# Patient Record
Sex: Male | Born: 1965
Health system: Southern US, Community
[De-identification: ages and names within clinical notes are randomized; demographics above are authoritative.]

## PROBLEM LIST (undated history)

## (undated) DIAGNOSIS — D573 Sickle-cell trait: Secondary | ICD-10-CM

## (undated) DIAGNOSIS — G4730 Sleep apnea, unspecified: Secondary | ICD-10-CM

## (undated) DIAGNOSIS — I10 Essential (primary) hypertension: Secondary | ICD-10-CM

## (undated) DIAGNOSIS — E7800 Pure hypercholesterolemia, unspecified: Secondary | ICD-10-CM

## (undated) DIAGNOSIS — E538 Deficiency of other specified B group vitamins: Secondary | ICD-10-CM

## (undated) HISTORY — PX: OTHER SURGICAL HISTORY: SHX169

## (undated) HISTORY — DX: Deficiency of other specified B group vitamins: E53.8

---

## 2009-12-17 ENCOUNTER — Emergency Department (HOSPITAL_BASED_OUTPATIENT_CLINIC_OR_DEPARTMENT_OTHER): Admission: EM | Admit: 2009-12-17 | Discharge: 2009-12-18 | Payer: Self-pay | Admitting: Emergency Medicine

## 2010-11-07 ENCOUNTER — Emergency Department (HOSPITAL_BASED_OUTPATIENT_CLINIC_OR_DEPARTMENT_OTHER)
Admission: EM | Admit: 2010-11-07 | Discharge: 2010-11-07 | Payer: Self-pay | Source: Home / Self Care | Admitting: Emergency Medicine

## 2010-11-12 LAB — URINALYSIS, ROUTINE W REFLEX MICROSCOPIC
Nitrite: NEGATIVE
Specific Gravity, Urine: 1.003 — ABNORMAL LOW (ref 1.005–1.030)
Urobilinogen, UA: 0.2 mg/dL (ref 0.0–1.0)
pH: 6.5 (ref 5.0–8.0)

## 2010-11-12 LAB — COMPREHENSIVE METABOLIC PANEL
AST: 48 U/L — ABNORMAL HIGH (ref 0–37)
Albumin: 5 g/dL (ref 3.5–5.2)
Alkaline Phosphatase: 95 U/L (ref 39–117)
Chloride: 101 mEq/L (ref 96–112)
Creatinine, Ser: 1 mg/dL (ref 0.4–1.5)
GFR calc Af Amer: >60 mL/min (ref >60)
Potassium: 4.8 mEq/L (ref 3.5–5.1)
Total Bilirubin: 1.1 mg/dL (ref 0.3–1.2)
Total Protein: 8.4 g/dL — ABNORMAL HIGH (ref 6.0–8.3)

## 2010-11-12 LAB — CBC
HCT: 37.6 % — ABNORMAL LOW (ref 39.0–52.0)
MCH: 20.8 pg — ABNORMAL LOW (ref 26.0–34.0)
MCV: 59.6 fL — ABNORMAL LOW (ref 78.0–100.0)
Platelets: 279 10*3/uL (ref 150–400)
RBC: 6.31 MIL/uL — ABNORMAL HIGH (ref 4.22–5.81)
WBC: 11.1 10*3/uL — ABNORMAL HIGH (ref 4.0–10.5)

## 2010-11-12 LAB — DIFFERENTIAL
Basophils Relative: 0 % (ref 0–1)
Eosinophils Absolute: 0.1 10*3/uL (ref 0.0–0.7)
Eosinophils Relative: 1 % (ref 0–5)
Lymphocytes Relative: 19 % (ref 12–46)
Neutro Abs: 7.9 10*3/uL — ABNORMAL HIGH (ref 1.7–7.7)

## 2010-11-12 LAB — POCT CARDIAC MARKERS
CKMB, poc: 2.1 ng/mL (ref 1.0–8.0)
Troponin i, poc: <0.05 ng/mL (ref 0.00–0.09)

## 2010-11-12 LAB — GLUCOSE, CAPILLARY: Glucose-Capillary: 118 mg/dL — ABNORMAL HIGH (ref 70–99)

## 2011-01-24 ENCOUNTER — Emergency Department (HOSPITAL_BASED_OUTPATIENT_CLINIC_OR_DEPARTMENT_OTHER)
Admission: EM | Admit: 2011-01-24 | Discharge: 2011-01-24 | Disposition: A | Discharge status: Home / Self Care | Payer: 59 | Attending: Emergency Medicine | Admitting: Emergency Medicine

## 2011-01-24 DIAGNOSIS — I10 Essential (primary) hypertension: Secondary | ICD-10-CM | POA: Insufficient documentation

## 2011-01-24 DIAGNOSIS — E7800 Pure hypercholesterolemia, unspecified: Secondary | ICD-10-CM | POA: Insufficient documentation

## 2011-01-24 LAB — GLUCOSE, CAPILLARY: Glucose-Capillary: 105 mg/dL — ABNORMAL HIGH (ref 70–99)

## 2012-04-10 IMAGING — CR DG CHEST 2V
2 series · 2 of 2 positions shown · non-contrast
Comparison: None.

CLINICAL DATA: Rapid pulse, dizziness

CHEST - 2 VIEW

[w chest pa]
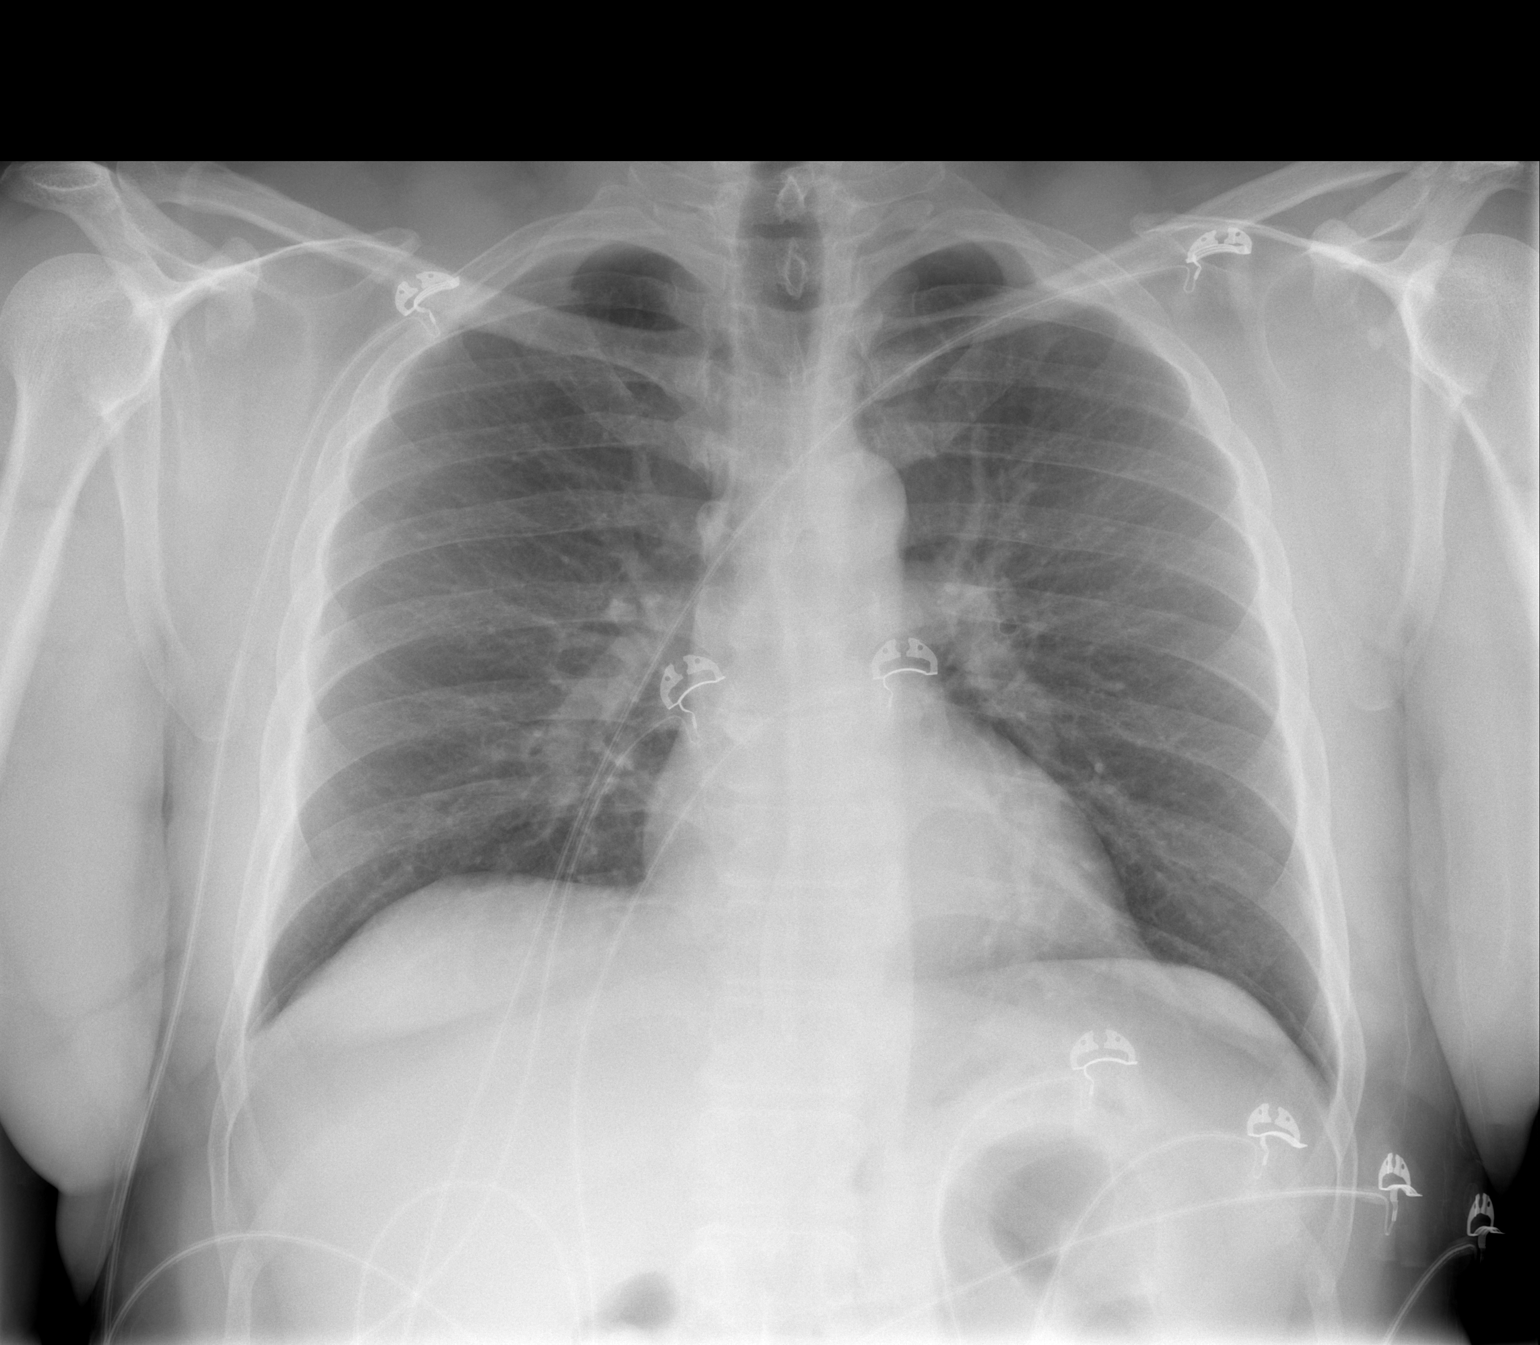

[w chest lat]
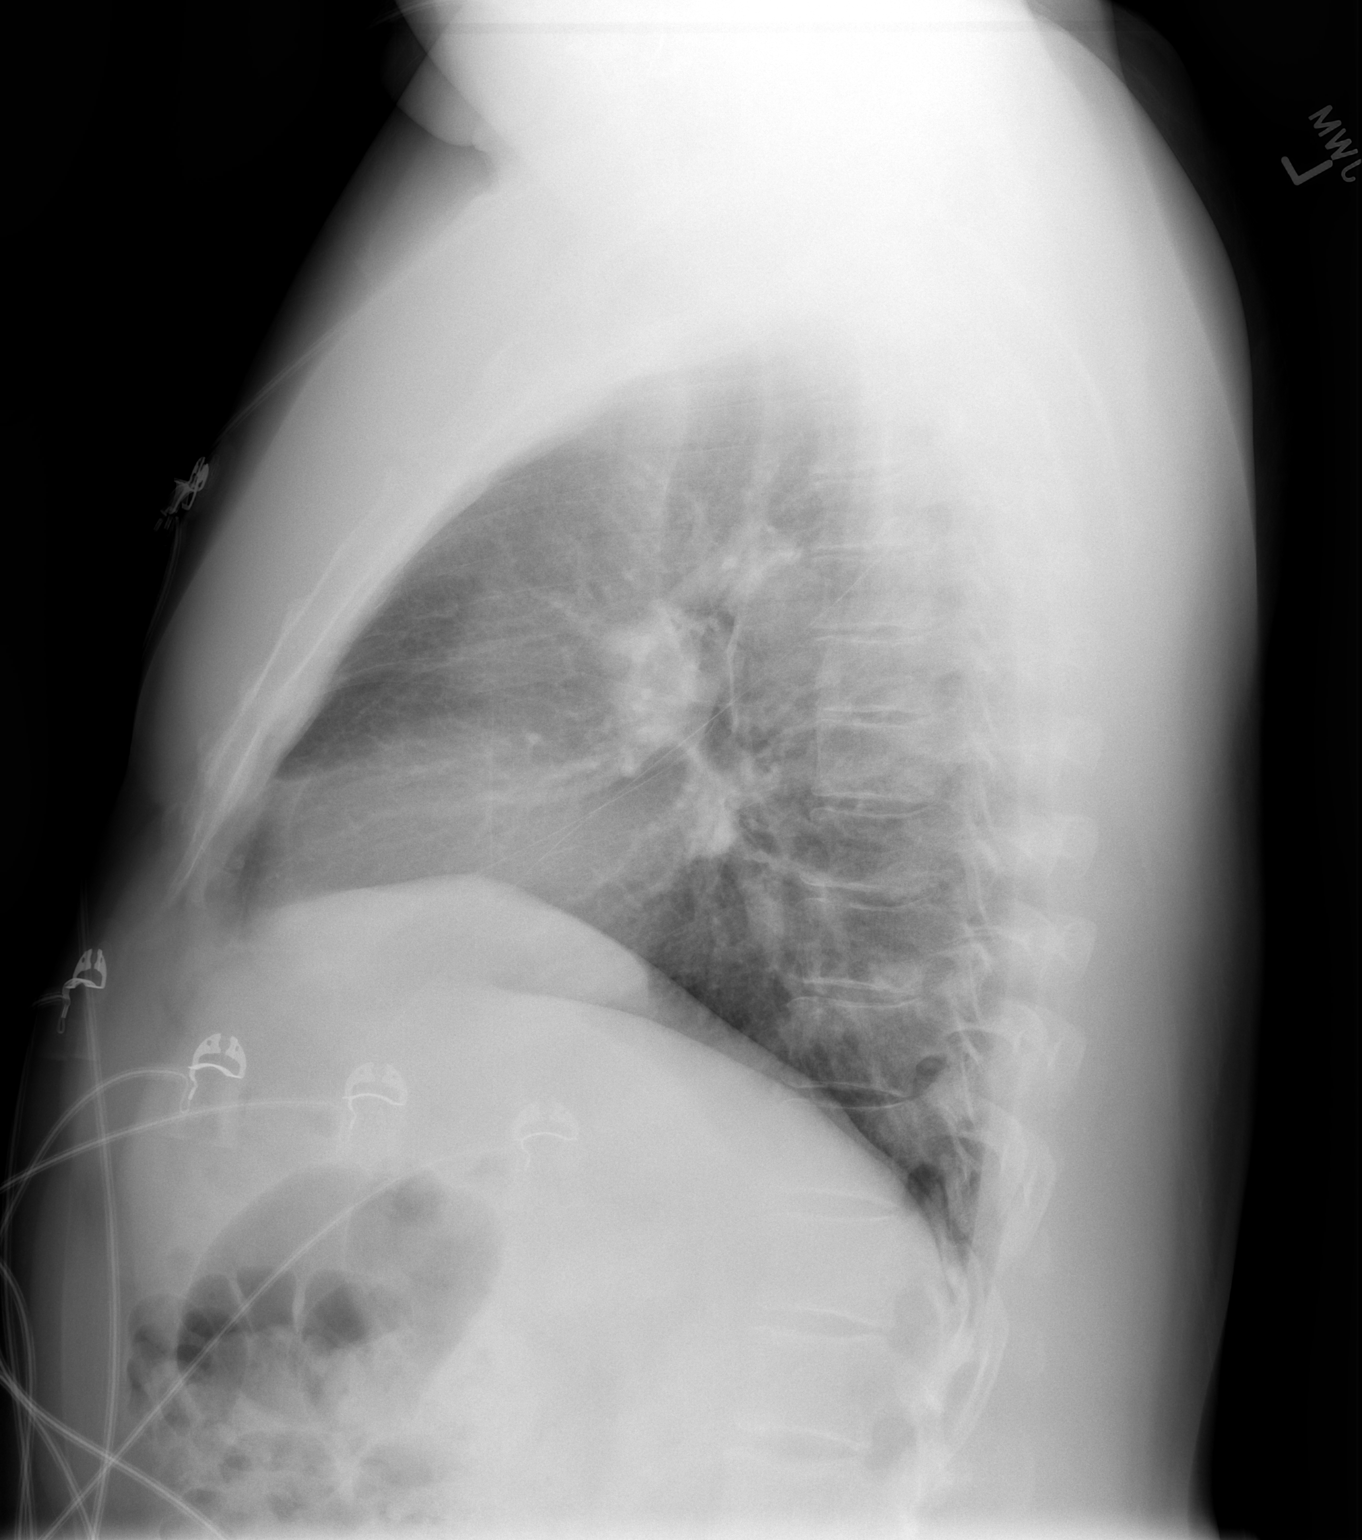

[2 of 2 positions shown; findings below may reference images not displayed]

FINDINGS: The lungs are clear.  Mediastinal contours appear normal.
The heart is within normal limits in size.  No bony abnormality is
seen.
IMPRESSION: No active lung disease.

## 2014-02-23 DIAGNOSIS — E7800 Pure hypercholesterolemia, unspecified: Secondary | ICD-10-CM | POA: Insufficient documentation

## 2014-12-02 DIAGNOSIS — E611 Iron deficiency: Secondary | ICD-10-CM | POA: Insufficient documentation

## 2016-01-08 DIAGNOSIS — D5780 Other sickle-cell disorders without crisis: Secondary | ICD-10-CM | POA: Insufficient documentation

## 2016-10-15 ENCOUNTER — Emergency Department (HOSPITAL_BASED_OUTPATIENT_CLINIC_OR_DEPARTMENT_OTHER): Payer: 59

## 2016-10-15 ENCOUNTER — Encounter (HOSPITAL_BASED_OUTPATIENT_CLINIC_OR_DEPARTMENT_OTHER): Payer: Self-pay

## 2016-10-15 ENCOUNTER — Emergency Department (HOSPITAL_BASED_OUTPATIENT_CLINIC_OR_DEPARTMENT_OTHER)
Admission: EM | Admit: 2016-10-15 | Discharge: 2016-10-15 | Disposition: A | Discharge status: Home / Self Care | Payer: 59 | Attending: Emergency Medicine | Admitting: Emergency Medicine

## 2016-10-15 DIAGNOSIS — I10 Essential (primary) hypertension: Secondary | ICD-10-CM

## 2016-10-15 DIAGNOSIS — R05 Cough: Secondary | ICD-10-CM | POA: Diagnosis not present

## 2016-10-15 DIAGNOSIS — R000 Tachycardia, unspecified: Secondary | ICD-10-CM

## 2016-10-15 DIAGNOSIS — R059 Cough, unspecified: Secondary | ICD-10-CM

## 2016-10-15 HISTORY — DX: Sickle-cell trait: D57.3

## 2016-10-15 HISTORY — DX: Essential (primary) hypertension: I10

## 2016-10-15 HISTORY — DX: Pure hypercholesterolemia, unspecified: E78.00

## 2016-10-15 HISTORY — DX: Sleep apnea, unspecified: G47.30

## 2016-10-15 LAB — CBC
HEMATOCRIT: 36.2 % — AB (ref 39.0–52.0)
HEMOGLOBIN: 12.1 g/dL — AB (ref 13.0–17.0)
MCH: 20.7 pg — AB (ref 26.0–34.0)
MCHC: 33.4 g/dL (ref 30.0–36.0)
MCV: 61.9 fL — AB (ref 78.0–100.0)
Platelets: 268 10*3/uL (ref 150–400)
RBC: 5.85 MIL/uL — ABNORMAL HIGH (ref 4.22–5.81)
RDW: 19.7 % — AB (ref 11.5–15.5)
WBC: 8.6 10*3/uL (ref 4.0–10.5)

## 2016-10-15 LAB — BASIC METABOLIC PANEL
Anion gap: 9 (ref 5–15)
BUN: 16 mg/dL (ref 6–20)
CALCIUM: 9.3 mg/dL (ref 8.9–10.3)
CHLORIDE: 102 mmol/L (ref 101–111)
CO2: 25 mmol/L (ref 22–32)
CREATININE: 1.02 mg/dL (ref 0.61–1.24)
GFR calc Af Amer: >60 mL/min (ref >60)
GFR calc non Af Amer: >60 mL/min (ref >60)
GLUCOSE: 115 mg/dL — AB (ref 65–99)
Potassium: 3.9 mmol/L (ref 3.5–5.1)
Sodium: 136 mmol/L (ref 135–145)

## 2016-10-15 MED ORDER — LORAZEPAM 1 MG PO TABS
1.0000 mg | ORAL_TABLET | Freq: Once | ORAL | Status: AC
  Administered 2016-10-15: 1 mg via ORAL
  Filled 2016-10-15: qty 1

## 2016-10-15 MED ORDER — SODIUM CHLORIDE 0.9 % IV BOLUS (SEPSIS)
1000.0000 mL | Freq: Once | INTRAVENOUS | Status: AC
  Administered 2016-10-15: 1000 mL via INTRAVENOUS

## 2016-10-15 NOTE — ED Notes (Signed)
Pt placed on cardiac monitor 

## 2016-10-15 NOTE — ED Triage Notes (Addendum)
C/o HTN and fast heart rate x today after taking mucinex for cough-NAD-pt states he is anxious-tapping feet on the ground-steady gait

## 2016-10-15 NOTE — ED Provider Notes (Signed)
[facility]   CSN: [id number] Arrival date & time: 10/15/16  1208     History   Chief Complaint Chief Complaint  Patient presents with  . Hypertension    HPI Philip Harris is a 50 y.o. male.  HPI  Pt presenting with concern for hypertension and high heart rate after taking mucinex DM for cough and cold symptoms today.  He states that he took his blood pressure multiple times and it was elevated, he became worried and his heart rate got higher and higher.  No chest pain.  No fever/chills.  No difficulty breathing. Cough is congested and productive.  He has not had any leg swelling, vomiting or diarrhea.  States he has been drinking liquids.  He thinks the medication he took may have had a decongestant in it- he describes a green and white box of mucinex DM.  There are no other associated systemic symptoms, there are no other alleviating or modifying factors.   Past Medical History:  Diagnosis Date  . High cholesterol   . Hypertension   . Sickle cell trait (HCC)   . Sleep apnea     There are no active problems to display for this patient.   History reviewed. No pertinent surgical history.     Home Medications    Prior to Admission medications   Medication Sig Start Date End Date Taking? Authorizing Provider  amLODipine (NORVASC) 5 MG tablet Take 5 mg by mouth daily.   Yes Historical Provider, MD  SIMVASTATIN PO Take by mouth.   Yes Historical Provider, MD    Family History No family history on file.  Social History Social History  Substance Use Topics  . Smoking status: Never Smoker  . Smokeless tobacco: Never Used  . Alcohol use Yes     Comment: occ     Allergies   Dextromethorphan   Review of Systems Review of Systems  ROS reviewed and all otherwise negative except for mentioned in HPI   Physical Exam Updated Vital Signs BP 140/75 (BP Location: Right Arm)   Pulse 100   Temp 98.8 F (37.1 C) (Oral)   Resp 18    SpO2 96%  Vitals reviewed Physical Exam Physical Examination: General appearance - alert, well appearing, and in no distress Mental status - alert, oriented to person, place, and time Eyes - no conjunctival injection, no scleral icterus Mouth - mucous membranes moist, pharynx normal without lesions Neck - supple, no significant adenopathy Chest - clear to auscultation, no wheezes, rales or rhonchi, symmetric air entry Heart - normal rate, regular rhythm, normal S1, S2, no murmurs, rubs, clicks or gallops Abdomen - soft, nontender, nondistended, no masses or organomegaly Neurological - alert, oriented, normal speech Extremities - peripheral pulses normal, no pedal edema, no clubbing or cyanosis Skin - normal coloration and turgor, no rashes Psych- anxious, shaking his leg  ED Treatments / Results  Labs (all labs ordered are listed, but only abnormal results are displayed) Labs Reviewed  CBC - Abnormal; Notable for the following:       Result Value   RBC 5.85 (*)    Hemoglobin 12.1 (*)    HCT 36.2 (*)    MCV 61.9 (*)    MCH 20.7 (*)    RDW 19.7 (*)    All other components within normal limits  BASIC METABOLIC PANEL - Abnormal; Notable for the following:    Glucose, Bld 115 (*)    All other components within normal limits  EKG  EKG Interpretation  Date/Time:  Tuesday October 15 2016 12:32:19 EST Ventricular Rate:  126 PR Interval:  150 QRS Duration: 68 QT Interval:  316 QTC Calculation: 457 R Axis:   82 Text Interpretation:  Sinus tachycardia ST & T wave abnormality, consider inferior ischemia Abnormal ECG artifact makes comparison difficult but no acute changes noted Confirmed by Karma GanjaLINKER  MD, Elgin Carn [phone]([id number]) on 10/15/2016 1:43:44 PM       Radiology Dg Chest 2 View  Result Date: 10/15/2016 CLINICAL DATA:  Hypertension and tachycardia today EXAM: CHEST  2 VIEW COMPARISON:  11/07/2010 FINDINGS: The lungs appear clear.  Cardiac and mediastinal contours normal. No  pleural effusion identified. Mild thoracic spondylosis. IMPRESSION: 1.  No active cardiopulmonary disease is radiographically apparent. 2. Mild thoracic spondylosis. Electronically Signed   By: Gaylyn RongWalter  Liebkemann M.D.   On: 10/15/2016 16:11    Procedures Procedures (including critical care time)  Medications Ordered in ED Medications  sodium chloride 0.9 % bolus 1,000 mL (0 mLs Intravenous Stopped 10/15/16 1456)  LORazepam (ATIVAN) tablet 1 mg (1 mg Oral Given 10/15/16 1352)  sodium chloride 0.9 % bolus 1,000 mL (0 mLs Intravenous Stopped 10/15/16 1556)     Initial Impression / Assessment and Plan / ED Course  I have reviewed the triage vital signs and the nursing notes.  Pertinent labs & imaging results that were available during my care of the patient were reviewed by me and considered in my medical decision making (see chart for details).  Clinical Course   4:10 PM HR is trending down- from nearly 130s to approx 100-105 on monitor.  Pt has no signs of end organ damage.    Pt presenting due to hypertension and elevated heart rated after taking cold medication today.  Pt is very anxious and tapping his feet which may be contributing to the tachycardia- although this is improving during ED stay.  CXR without pnuemonia.  Discharged with strict return precautions.  Pt agreeable with plan.  Final Clinical Impressions(s) / ED Diagnoses   Final diagnoses:  Hypertension, unspecified type  Tachycardia  Cough    New Prescriptions Discharge Medication List as of 10/15/2016  4:17 PM       Jerelyn ScottMartha Linker, MD 10/16/16 [phone]

## 2016-10-15 NOTE — ED Notes (Signed)
Pt states the medicine he took was Mucinex DM.

## 2016-10-15 NOTE — Discharge Instructions (Signed)
Return to the ED with any concerns including difficulty breathing, chest pain, weakness in arms or legs, changes in vision or speech, decreased level of alertness/lethargy, or any other alarming symptoms

## 2016-10-16 ENCOUNTER — Emergency Department (HOSPITAL_BASED_OUTPATIENT_CLINIC_OR_DEPARTMENT_OTHER)
Admission: EM | Admit: 2016-10-16 | Discharge: 2016-10-16 | Disposition: A | Discharge status: Home / Self Care | Payer: 59 | Attending: Emergency Medicine | Admitting: Emergency Medicine

## 2016-10-16 ENCOUNTER — Encounter (HOSPITAL_BASED_OUTPATIENT_CLINIC_OR_DEPARTMENT_OTHER): Payer: Self-pay

## 2016-10-16 DIAGNOSIS — I10 Essential (primary) hypertension: Secondary | ICD-10-CM | POA: Diagnosis not present

## 2016-10-16 DIAGNOSIS — F419 Anxiety disorder, unspecified: Secondary | ICD-10-CM | POA: Insufficient documentation

## 2016-10-16 DIAGNOSIS — R0602 Shortness of breath: Secondary | ICD-10-CM | POA: Insufficient documentation

## 2016-10-16 DIAGNOSIS — Z79899 Other long term (current) drug therapy: Secondary | ICD-10-CM | POA: Insufficient documentation

## 2016-10-16 DIAGNOSIS — F418 Other specified anxiety disorders: Secondary | ICD-10-CM

## 2016-10-16 DIAGNOSIS — R000 Tachycardia, unspecified: Secondary | ICD-10-CM | POA: Diagnosis present

## 2016-10-16 MED ORDER — CLONIDINE HCL 0.1 MG PO TABS: 0.1000 mg | ORAL_TABLET | Freq: Two times a day (BID) | ORAL | 0 refills | Status: DC | PRN

## 2016-10-16 MED ORDER — CLONIDINE HCL 0.1 MG PO TABS
0.2000 mg | ORAL_TABLET | Freq: Once | ORAL | Status: AC
  Administered 2016-10-16: 0.2 mg via ORAL
  Filled 2016-10-16: qty 2

## 2016-10-16 MED ORDER — CLONIDINE HCL 0.2 MG PO TABS: 0.2000 mg | ORAL_TABLET | Freq: Two times a day (BID) | ORAL | 0 refills | Status: DC | PRN

## 2016-10-16 MED FILL — cloNIDine HCL 0.1 MG TABS: 0.1 | 3 days supply | Qty: 6 | Fill #0

## 2016-10-16 NOTE — ED Triage Notes (Signed)
Pt treating cold s/s with OTC meds-c/o heart racing after meds-same s/s yesterday-was seen here for same-NAD-steady gait

## 2016-10-16 NOTE — ED Provider Notes (Signed)
[facility]   CSN: [id number] Arrival date & time: 10/16/16  1350     History   Chief Complaint Chief Complaint  Patient presents with  . Tachycardia    HPI Philip Harris is a 50 y.o. male.  The history is provided by the patient.  Anxiety  This is a recurrent problem. The current episode started 6 to 12 hours ago. The problem occurs constantly. The problem has not changed since onset.Associated symptoms include shortness of breath. Pertinent negatives include no chest pain. Exacerbated by: oral decongestants taken today and yesterday. Nothing relieves the symptoms.    Past Medical History:  Diagnosis Date  . High cholesterol   . Hypertension   . Sickle cell trait (HCC)   . Sleep apnea     There are no active problems to display for this patient.   History reviewed. No pertinent surgical history.     Home Medications    Prior to Admission medications   Medication Sig Start Date End Date Taking? Authorizing Provider  amLODipine (NORVASC) 5 MG tablet Take 5 mg by mouth daily.    Historical Provider, MD  SIMVASTATIN PO Take by mouth.    Historical Provider, MD    Family History No family history on file.  Social History Social History  Substance Use Topics  . Smoking status: Never Smoker  . Smokeless tobacco: Never Used  . Alcohol use Yes     Comment: occ     Allergies   Dextromethorphan   Review of Systems Review of Systems  HENT: Positive for congestion (and pressure in left ear ).   Respiratory: Positive for shortness of breath.   Cardiovascular: Negative for chest pain.  All other systems reviewed and are negative.    Physical Exam Updated Vital Signs BP 130/73   Pulse 93   Temp 98.1 F (36.7 C) (Oral)   Resp 18   SpO2 96%   Physical Exam  Constitutional: He is oriented to person, place, and time. He appears well-developed and well-nourished. No distress.  HENT:  Head: Normocephalic and atraumatic.    Right Ear: Tympanic membrane normal. No middle ear effusion.  Left Ear: Tympanic membrane normal.  No middle ear effusion.  Nose: Nose normal.  Eyes: Conjunctivae are normal.  Neck: Neck supple. No tracheal deviation present.  Cardiovascular: Regular rhythm.  Tachycardia present.   Pulmonary/Chest: Effort normal and breath sounds normal. No respiratory distress.  Abdominal: Soft. He exhibits no distension. There is no tenderness.  Neurological: He is alert and oriented to person, place, and time.  Skin: Skin is warm and dry.  Psychiatric: His mood appears anxious.     ED Treatments / Results  Labs (all labs ordered are listed, but only abnormal results are displayed) Labs Reviewed - No data to display  EKG  EKG Interpretation  Date/Time:  Wednesday October 16 2016 14:05:04 EST Ventricular Rate:  126 PR Interval:  148 QRS Duration: 82 QT Interval:  304 QTC Calculation: 440 R Axis:   80 Text Interpretation:  Sinus tachycardia ST & T wave abnormality, consider inferolateral ischemia Abnormal ECG No significant change since last tracing Confirmed by [facility] Center[facility]  MD, MARTHA [phone]) on 10/16/2016 2:30:13 PM       Radiology Dg Chest 2 View  Result Date: 10/15/2016 CLINICAL DATA:  Hypertension and tachycardia today EXAM: CHEST  2 VIEW COMPARISON:  11/07/2010 FINDINGS: The lungs appear clear.  Cardiac and mediastinal contours normal. No pleural effusion identified. Mild thoracic spondylosis. IMPRESSION:  1.  No active cardiopulmonary disease is radiographically apparent. 2. Mild thoracic spondylosis. Electronically Signed   By: Gaylyn RongWalter  Liebkemann M.D.   On: 10/15/2016 16:11    Procedures Procedures (including critical care time)  Medications Ordered in ED Medications  cloNIDine (CATAPRES) tablet 0.2 mg (0.2 mg Oral Given 10/16/16 1548)     Initial Impression / Assessment and Plan / ED Course  I have reviewed the triage vital signs and the nursing notes.  Pertinent labs &  imaging results that were available during my care of the patient were reviewed by me and considered in my medical decision making (see chart for details).  Clinical Course     50 y.o. male presents with recurrent episode of palpitations after taking oral decongestants. He is very concerned that his BP has been elevated and his HR high and is upset because he cannot find anything to treat his left ear congestion. With just resting his heart rate fell to the low 90s. Well appearing. I suspect he has a viral URI and appears very anxious about his health but has had no chest pain or other concerning features. No signs of PE. Tachycardia is all sinus. Given clonidine with improvement of anxiety and provided a few pills for breakthrough anxiety at home. I recommended he take an antihistamine with lower side effect profile than the decongestants he has been using. Plan to follow up with PCP as needed and return precautions discussed for worsening or new concerning symptoms.   Final Clinical Impressions(s) / ED Diagnoses   Final diagnoses:  Anxiety about health  Sinus tachycardia    New Prescriptions Discharge Medication List as of 10/16/2016  4:24 PM       Lyndal Pulleyaniel Deni Berti, MD 10/17/16 0151

## 2016-10-16 NOTE — ED Notes (Signed)
ED Provider at bedside. 

## 2017-11-17 ENCOUNTER — Emergency Department (HOSPITAL_BASED_OUTPATIENT_CLINIC_OR_DEPARTMENT_OTHER)
Admission: EM | Admit: 2017-11-17 | Discharge: 2017-11-17 | Disposition: A | Discharge status: Home / Self Care | Payer: 59 | Attending: Physician Assistant | Admitting: Physician Assistant

## 2017-11-17 ENCOUNTER — Emergency Department (HOSPITAL_BASED_OUTPATIENT_CLINIC_OR_DEPARTMENT_OTHER): Payer: 59

## 2017-11-17 ENCOUNTER — Encounter (HOSPITAL_BASED_OUTPATIENT_CLINIC_OR_DEPARTMENT_OTHER): Payer: Self-pay | Admitting: Emergency Medicine

## 2017-11-17 ENCOUNTER — Other Ambulatory Visit: Payer: Self-pay

## 2017-11-17 DIAGNOSIS — Z79899 Other long term (current) drug therapy: Secondary | ICD-10-CM | POA: Diagnosis not present

## 2017-11-17 DIAGNOSIS — R002 Palpitations: Secondary | ICD-10-CM

## 2017-11-17 DIAGNOSIS — I10 Essential (primary) hypertension: Secondary | ICD-10-CM | POA: Diagnosis not present

## 2017-11-17 LAB — CBC WITH DIFFERENTIAL/PLATELET
Basophils Absolute: 0.1 10*3/uL (ref 0.0–0.1)
Basophils Relative: 1 %
EOS ABS: 0 10*3/uL (ref 0.0–0.7)
Eosinophils Relative: 0 %
HEMATOCRIT: 37.6 % — AB (ref 39.0–52.0)
Hemoglobin: 12.4 g/dL — ABNORMAL LOW (ref 13.0–17.0)
LYMPHS ABS: 1.9 10*3/uL (ref 0.7–4.0)
Lymphocytes Relative: 20 %
MCH: 20.5 pg — ABNORMAL LOW (ref 26.0–34.0)
MCHC: 33 g/dL (ref 30.0–36.0)
MCV: 62 fL — ABNORMAL LOW (ref 78.0–100.0)
MONO ABS: 0.8 10*3/uL (ref 0.1–1.0)
MONOS PCT: 9 %
NEUTROS ABS: 6.6 10*3/uL (ref 1.7–7.7)
Neutrophils Relative %: 70 %
PLATELETS: 277 10*3/uL (ref 150–400)
RBC: 6.06 MIL/uL — AB (ref 4.22–5.81)
RDW: 20.4 % — ABNORMAL HIGH (ref 11.5–15.5)
WBC: 9.4 10*3/uL (ref 4.0–10.5)

## 2017-11-17 LAB — COMPREHENSIVE METABOLIC PANEL
ALK PHOS: 77 U/L (ref 38–126)
ALT: 36 U/L (ref 17–63)
AST: 22 U/L (ref 15–41)
Albumin: 4.5 g/dL (ref 3.5–5.0)
Anion gap: 9 (ref 5–15)
BILIRUBIN TOTAL: 0.7 mg/dL (ref 0.3–1.2)
BUN: 9 mg/dL (ref 6–20)
CALCIUM: 9 mg/dL (ref 8.9–10.3)
CO2: 23 mmol/L (ref 22–32)
Chloride: 105 mmol/L (ref 101–111)
Creatinine, Ser: 1.04 mg/dL (ref 0.61–1.24)
GFR calc Af Amer: >60 mL/min (ref >60)
GLUCOSE: 100 mg/dL — AB (ref 65–99)
Potassium: 3.7 mmol/L (ref 3.5–5.1)
Sodium: 137 mmol/L (ref 135–145)
TOTAL PROTEIN: 7.8 g/dL (ref 6.5–8.1)

## 2017-11-17 LAB — TROPONIN I

## 2017-11-17 LAB — MAGNESIUM: MAGNESIUM: 2.3 mg/dL (ref 1.7–2.4)

## 2017-11-17 MED ORDER — SODIUM CHLORIDE 0.9 % IV BOLUS (SEPSIS)
500.0000 mL | Freq: Once | INTRAVENOUS | Status: AC
  Administered 2017-11-17: 500 mL via INTRAVENOUS

## 2017-11-17 NOTE — ED Provider Notes (Signed)
[facility] EMERGENCY DEPARTMENT Provider Note   CSN: [id number] Arrival date & time: 11/17/17  1705     History   Chief Complaint Chief Complaint  Patient presents with  . Palpitations    HPI Philip Harris is a 52 y.o. male with a history of hypertension, sleep apnea, sickle cell trait, presents today for evaluation of palpitations.  He reports that he has been noticing palpitations on and off since around noon.  He reports that he feels like his heart is beating faster and at one point was beating 126 bpm.  He reports that when he notices his heart rate going up he begins to get anxious and nervous which may worsen it.  He denies any recent cough, congestion, no nausea/vomiting, no chest pain or shortness of breath.  He does report significant anxiety, denies consuming any caffeine today.   HPI  Past Medical History:  Diagnosis Date  . High cholesterol   . Hypertension   . Sickle cell trait (HCC)   . Sleep apnea    CPAP    There are no active problems to display for this patient.   Past Surgical History:  Procedure Laterality Date  . None         Home Medications    Prior to Admission medications   Medication Sig Start Date End Date Taking? Authorizing Provider  amLODipine (NORVASC) 5 MG tablet Take 5 mg by mouth daily.    [provider]  cloNIDine (CATAPRES) 0.1 MG tablet Take 1 tablet (0.1 mg total) by mouth 2 (two) times daily as needed (anxiety). 10/16/16   Lyndal Pulley, MD  propranolol (INDERAL) 10 MG tablet Take 1 tablet (10 mg total) by mouth 3 (three) times daily. 11/18/17   Rollene Rotunda, MD  SIMVASTATIN PO Take by mouth.    [provider]    Family History History reviewed. No pertinent family history.  Social History Social History   Tobacco Use  . Smoking status: Never Smoker  . Smokeless tobacco: Never Used  Substance Use Topics  . Alcohol use: Yes    Comment: occ  . Drug use: No     Allergies     Dextromethorphan   Review of Systems Review of Systems  Constitutional: Negative for chills, fatigue and fever.  Eyes: Negative for visual disturbance.  Respiratory: Negative for cough, chest tightness and shortness of breath.   Cardiovascular: Positive for palpitations. Negative for chest pain and leg swelling.  Gastrointestinal: Negative for abdominal pain, nausea and vomiting.  Neurological: Negative for headaches.  All other systems reviewed and are negative.    Physical Exam Updated Vital Signs BP 135/84   Pulse 86   Temp 98 F (36.7 C) (Oral)   Resp 13   Ht 5\' 5"  (1.651 m)   Wt 101.6 kg (224 lb)   SpO2 99%   BMI 37.28 kg/m   Physical Exam  Constitutional: He appears well-developed and well-nourished. No distress.  HENT:  Head: Normocephalic and atraumatic.  Eyes: Conjunctivae are normal. Right eye exhibits no discharge. Left eye exhibits no discharge. No scleral icterus.  Neck: Normal range of motion. Neck supple. No JVD present.  Cardiovascular: Normal rate, regular rhythm, normal heart sounds and intact distal pulses. Exam reveals no friction rub.  No murmur heard. Pulmonary/Chest: Effort normal and breath sounds normal. No stridor. No respiratory distress.  Abdominal: Bowel sounds are normal. He exhibits no distension. There is no tenderness.  Musculoskeletal: He exhibits no edema or deformity.  Neurological: He is alert. He exhibits normal muscle tone.  Skin: Skin is warm and dry. He is not diaphoretic.  Psychiatric: He has a normal mood and affect. His behavior is normal.  Nursing note and vitals reviewed.    ED Treatments / Results  Labs (all labs ordered are listed, but only abnormal results are displayed) Labs Reviewed  COMPREHENSIVE METABOLIC PANEL - Abnormal; Notable for the following components:      Result Value   Glucose, Bld 100 (*)    All other components within normal limits  CBC WITH DIFFERENTIAL/PLATELET - Abnormal; Notable for the  following components:   RBC 6.06 (*)    Hemoglobin 12.4 (*)    HCT 37.6 (*)    MCV 62.0 (*)    MCH 20.5 (*)    RDW 20.4 (*)    All other components within normal limits  MAGNESIUM  TROPONIN I  TROPONIN I    EKG  EKG Interpretation  Date/Time:  Monday November 17 2017 17:09:43 EST Ventricular Rate:  95 PR Interval:  152 QRS Duration: 84 QT Interval:  338 QTC Calculation: 424 R Axis:   70 Text Interpretation:  Normal sinus rhythm T wave abnormality, consider inferior ischemia Abnormal ECG Since last tracing rate slower Confirmed by Doug SouJacubowitz, Sam [phone]([id number]) on 11/18/2017 11:07:00 AM       Radiology Dg Chest 2 View  Result Date: 11/17/2017 CLINICAL DATA:  Acute palpitations today. EXAM: CHEST  2 VIEW COMPARISON:  10/15/2016 FINDINGS: The cardiomediastinal silhouette is unremarkable. There is no evidence of focal airspace disease, pulmonary edema, suspicious pulmonary nodule/mass, pleural effusion, or pneumothorax. No acute bony abnormalities are identified. IMPRESSION: No active cardiopulmonary disease. Electronically Signed   By: Harmon PierJeffrey  Hu M.D.   On: 11/17/2017 20:42    Procedures Procedures (including critical care time)  Medications Ordered in ED Medications  sodium chloride 0.9 % bolus 500 mL (0 mLs Intravenous Stopped 11/17/17 2002)     Initial Impression / Assessment and Plan / ED Course  I have reviewed the triage vital signs and the nursing notes.  Pertinent labs & imaging results that were available during my care of the patient were reviewed by me and considered in my medical decision making (see chart for details).    Philip Harris presents today for evaluation of occasional palpitations but occurred a few hours prior to arrival.  He does not have any significant cardiac past medical history.  He was observed on cardiac monitoring emergency room for multiple hours without cause for palpitations found.  He denies any chest pain.  Troponins normal x2.  Chest  x-ray without acute abnormalities, EKG obtained and reviewed without cause for palpitations.  Heart score places patient in low risk category, I am not concerned for ACS as patient is currently asymptomatic with normal troponin x2.  He was given follow-up with cardiology.  He was given return precautions, and states his understanding.  This patient was discussed with my supervising physician who agreed with my plan.   Final Clinical Impressions(s) / ED Diagnoses   Final diagnoses:  Palpitations    ED Discharge Orders    None       Norman ClayHammond, Laren Orama W, PA-C 11/19/17 1630    Mackuen, Cindee Saltourteney Lyn, MD 11/20/17 1555

## 2017-11-17 NOTE — ED Triage Notes (Signed)
Patient states that he feels like his heart is beating faster than normal/. States that he is having palpitations and nervous about it

## 2017-11-17 NOTE — Discharge Instructions (Signed)
I have given you follow up with a cardiologist for your palpitations.  If you chest pain, shortness of breath, or have any additional concerns please seek additional medical evaluation.

## 2017-11-18 ENCOUNTER — Ambulatory Visit: Payer: 59 | Admitting: Cardiology

## 2017-11-18 ENCOUNTER — Encounter: Payer: Self-pay | Admitting: Cardiology

## 2017-11-18 VITALS — BP 162/80 | HR 99 | Ht 66.0 in | Wt 226.0 lb

## 2017-11-18 DIAGNOSIS — R002 Palpitations: Secondary | ICD-10-CM | POA: Diagnosis not present

## 2017-11-18 MED ORDER — PROPRANOLOL HCL 10 MG PO TABS: 10.0000 mg | ORAL_TABLET | Freq: Three times a day (TID) | ORAL | 11 refills | Status: DC

## 2017-11-18 NOTE — Patient Instructions (Signed)
Medication Instructions:  START- Propranolol 10 mg three times a day  If you need a refill on your cardiac medications before your next appointment, please call your pharmacy.  Labwork: None Ordered   Testing/Procedures: None Ordered  Follow-Up: Your physician wants you to follow-up in: 6 Weeks.   Thank you for choosing [facility]!!

## 2017-11-18 NOTE — Progress Notes (Signed)
Cardiology Office Note   Date:  11/19/2017   ID:  Philip Harris, DOB [birth year], MRN [id number]  PCP:  Philip Mylar, MD  Cardiologist:   No primary care provider on file. Referring:     Chief Complaint  Patient presents with  . Palpitations      History of Present Illness: Philip Harris is a 52 y.o. male who presents for follow-up palpitations. He has no prior cardiac history but he has been treated for hypertension recently.  However, he has noticed on his BP cuff that his HR is occasionally up into the 120s.    Last year he was started on Norvasc.  This was after he was seen in the ED.  He was increased from 5-10 mg but he developed edema.  He was subsequently tried on Cozaar but this made his heart rate go up further.  He stopped this.  He was in the ED again yesterday.   I reviewed these records for this visit.    He had a rapid heart rate but the work up was normal.  He had no arrhythmias on EKG.  TSH was not drawn.    He has otherwise done relatively well. He started to walk at work and on a treadmill.  He says that his heart rate goes up when he feels somewhat anxious. when he feels somewhat anxious.  He has been told that he might have anxiety issues. He does feel nervous at times.  He denies any other cardiovascular symptoms. He doesn't have chest pressure, neck or arm discomfort. He doesn't have shortness of breath, PND or orthopnea.    Past Medical History:  Diagnosis Date  . High cholesterol   . Hypertension   . Sickle cell trait (HCC)   . Sleep apnea    CPAP    Past Surgical History:  Procedure Laterality Date  . None       Current Outpatient Medications  Medication Sig Dispense Refill  . amLODipine (NORVASC) 5 MG tablet Take 5 mg by mouth daily.    . cloNIDine (CATAPRES) 0.1 MG tablet Take 1 tablet (0.1 mg total) by mouth 2 (two) times daily as needed (anxiety). 6 tablet 0  . SIMVASTATIN PO Take by mouth.    . propranolol (INDERAL) 10 MG  tablet Take 1 tablet (10 mg total) by mouth 3 (three) times daily. 90 tablet 11   No current facility-administered medications for this visit.     Allergies:   Dextromethorphan    Social History:  The patient  reports that  has never smoked. he has never used smokeless tobacco. He reports that he drinks alcohol. He reports that he does not use drugs.   Family History:    Mother died during childbirth.  Father had "medical illness including depression" and died in his sleep age 65.  Sister with MI age 32   ROS:  Please see the history of present illness.   Otherwise, review of systems are positive for none.   All other systems are reviewed and negative.    PHYSICAL EXAM: VS:  BP (!) 162/80   Pulse 99   Ht 5\' 6"  (1.676 m)   Wt 226 lb (102.5 kg)   BMI 36.48 kg/m  , BMI Body mass index is 36.48 kg/m. GENERAL:  Well appearing HEENT:  Pupils equal round and reactive, fundi not visualized, oral mucosa unremarkable NECK:  No jugular venous distention, waveform within normal limits, carotid upstroke brisk and symmetric, no bruits,  no thyromegaly LYMPHATICS:  No cervical, inguinal adenopathy LUNGS:  Clear to auscultation bilaterally BACK:  No CVA tenderness CHEST:  Unremarkable HEART:  PMI not displaced or sustained,S1 and S2 within normal limits, no S3, no S4, no clicks, no rubs, no murmurs ABD:  Flat, positive bowel sounds normal in frequency in pitch, no bruits, no rebound, no guarding, no midline pulsatile mass, no hepatomegaly, no splenomegaly EXT:  2 plus pulses throughout, no edema, no cyanosis no clubbing SKIN:  No rashes no nodules NEURO:  Cranial nerves II through XII grossly intact, motor grossly intact throughout PSYCH:  Cognitively intact, oriented to person place and time    EKG:  EKG not ordered today. The ekg ordered 11/17/61 demonstrates sinus rhythm, rate 95, axis within normal limits, nonspecific use T wave flattening no acute ST-T wave changes.   Recent  Labs: 11/17/2017: ALT 36; BUN 9; Creatinine, Ser 1.04; Hemoglobin 12.4; Magnesium 2.3; Platelets 277; Potassium 3.7; Sodium 137    Lipid Panel No results found for: CHOL, TRIG, HDL, CHOLHDL, VLDL, LDLCALC, LDLDIRECT    Wt Readings from Last 3 Encounters:  11/18/17 226 lb (102.5 kg)  11/17/17 224 lb (101.6 kg)      Other studies Reviewed: Additional studies/ records that were reviewed today include: ED records. Review of the above records demonstrates:  Please see elsewhere in the note.     ASSESSMENT AND PLAN:  TACHYCARDIA:  At this point I think he's probably having sinus tachycardia possibly triggered by anxiety but asked him to get an Alive Cor to try to records these.  I will give him propranolol 10 mg to take as needed for rapid heart rates.   Of note he had a recent TSH which was normal.  Further evaluation will be based on future symptoms. We did talk about following up with his primary care physician to consider anxiety as an etiology and to also consider nonmedical therapies for this.  HTN:  I reviewed a blood pressure diary. Overall his blood pressure is well controlled I Harris't think he needs further adjustment to his meds.   Current medicines are reviewed at length with the patient today.  The patient does not have concerns regarding medicines.  The following changes have been made:  no change  Labs/ tests ordered today include: TSH No orders of the defined types were placed in this encounter.    Disposition:   FU with me in six weeks.      Signed, Rollene RotundaJames Jeronda Don, MD  11/19/2017 2:02 PM    [facility]

## 2017-11-19 ENCOUNTER — Telehealth: Payer: Self-pay | Admitting: Cardiology

## 2017-11-19 ENCOUNTER — Encounter: Payer: Self-pay | Admitting: Cardiology

## 2017-11-19 NOTE — Telephone Encounter (Signed)
New message   Patient states he is calling per Dr Hochrein's request, to discuss labs. Please call

## 2017-11-19 NOTE — Telephone Encounter (Signed)
Returned call to patient who states he returning a call to the MD.   Call transferred

## 2017-11-19 NOTE — Telephone Encounter (Signed)
Left message for pt to call.

## 2017-11-19 NOTE — Telephone Encounter (Signed)
Dr Antoine PocheHochrein spoke with pt for family history

## 2017-12-01 ENCOUNTER — Telehealth: Payer: Self-pay | Admitting: *Deleted

## 2017-12-01 DIAGNOSIS — R5383 Other fatigue: Secondary | ICD-10-CM

## 2017-12-01 NOTE — Telephone Encounter (Signed)
TSH ordered.

## 2017-12-01 NOTE — Telephone Encounter (Signed)
-----   Message from Rollene RotundaJames Hochrein, MD sent at 11/18/2017  5:29 PM EST ----- Needs a TSH

## 2017-12-29 ENCOUNTER — Telehealth: Payer: Self-pay | Admitting: Cardiology

## 2017-12-29 NOTE — Telephone Encounter (Signed)
Closed Encounter  °

## 2018-01-05 ENCOUNTER — Ambulatory Visit: Payer: 59 | Admitting: Cardiology

## 2018-01-07 ENCOUNTER — Encounter: Payer: Self-pay | Admitting: Cardiology

## 2018-01-15 ENCOUNTER — Ambulatory Visit: Payer: 59 | Admitting: Cardiology

## 2018-04-16 ENCOUNTER — Encounter: Payer: Self-pay | Admitting: Cardiology

## 2018-04-16 MED ORDER — AMLODIPINE BESYLATE 5 MG PO TABS: 5.0000 mg | ORAL_TABLET | Freq: Every day | ORAL | 3 refills | Status: DC

## 2018-08-08 ENCOUNTER — Other Ambulatory Visit: Payer: Self-pay | Admitting: Cardiology

## 2018-08-18 ENCOUNTER — Other Ambulatory Visit: Payer: Self-pay

## 2018-08-18 MED ORDER — SIMVASTATIN 20 MG PO TABS: 20.0000 mg | ORAL_TABLET | Freq: Every day | ORAL | 0 refills | Status: DC

## 2018-08-28 ENCOUNTER — Ambulatory Visit: Payer: 59 | Admitting: Cardiology

## 2018-09-10 ENCOUNTER — Other Ambulatory Visit: Payer: Self-pay | Admitting: Cardiology

## 2018-11-19 ENCOUNTER — Other Ambulatory Visit: Payer: Self-pay | Admitting: *Deleted

## 2018-11-19 ENCOUNTER — Other Ambulatory Visit: Payer: Self-pay | Admitting: Cardiology

## 2018-11-19 MED ORDER — PROPRANOLOL HCL 10 MG PO TABS: 10.0000 mg | ORAL_TABLET | Freq: Three times a day (TID) | ORAL | 1 refills | Status: DC

## 2018-11-19 NOTE — Telephone Encounter (Signed)
Rx request sent to pharmacy.  

## 2018-11-24 ENCOUNTER — Other Ambulatory Visit: Payer: Self-pay | Admitting: Cardiology

## 2018-12-21 DIAGNOSIS — G4733 Obstructive sleep apnea (adult) (pediatric): Secondary | ICD-10-CM | POA: Insufficient documentation

## 2019-01-22 ENCOUNTER — Other Ambulatory Visit: Payer: Self-pay | Admitting: Cardiology

## 2019-04-23 ENCOUNTER — Other Ambulatory Visit: Payer: Self-pay | Admitting: Cardiology

## 2019-04-26 NOTE — Telephone Encounter (Signed)
Rx(s) sent to pharmacy electronically.  

## 2019-06-02 ENCOUNTER — Other Ambulatory Visit: Payer: Self-pay | Admitting: Cardiology

## 2019-07-19 ENCOUNTER — Other Ambulatory Visit: Payer: Self-pay

## 2019-07-19 MED ORDER — PROPRANOLOL HCL 10 MG PO TABS: 10.0000 mg | ORAL_TABLET | Freq: Three times a day (TID) | ORAL | 0 refills | Status: DC

## 2019-07-19 NOTE — Telephone Encounter (Signed)
Rx(s) sent to pharmacy electronically.  

## 2019-08-26 ENCOUNTER — Other Ambulatory Visit: Payer: Self-pay

## 2019-08-26 MED ORDER — PROPRANOLOL HCL 10 MG PO TABS: 10.0000 mg | ORAL_TABLET | Freq: Three times a day (TID) | ORAL | 3 refills | Status: DC

## 2020-02-14 ENCOUNTER — Other Ambulatory Visit: Payer: Self-pay | Admitting: Cardiology

## 2020-03-27 ENCOUNTER — Emergency Department (HOSPITAL_BASED_OUTPATIENT_CLINIC_OR_DEPARTMENT_OTHER)
Admission: EM | Admit: 2020-03-27 | Discharge: 2020-03-27 | Disposition: A | Discharge status: Home / Self Care | Payer: 59 | Attending: Emergency Medicine | Admitting: Emergency Medicine

## 2020-03-27 ENCOUNTER — Encounter (HOSPITAL_BASED_OUTPATIENT_CLINIC_OR_DEPARTMENT_OTHER): Payer: Self-pay | Admitting: *Deleted

## 2020-03-27 ENCOUNTER — Other Ambulatory Visit: Payer: Self-pay

## 2020-03-27 DIAGNOSIS — I10 Essential (primary) hypertension: Secondary | ICD-10-CM | POA: Insufficient documentation

## 2020-03-27 DIAGNOSIS — Z79899 Other long term (current) drug therapy: Secondary | ICD-10-CM | POA: Insufficient documentation

## 2020-03-27 DIAGNOSIS — R000 Tachycardia, unspecified: Secondary | ICD-10-CM

## 2020-03-27 DIAGNOSIS — R002 Palpitations: Secondary | ICD-10-CM | POA: Diagnosis present

## 2020-03-27 DIAGNOSIS — Z7189 Other specified counseling: Secondary | ICD-10-CM | POA: Insufficient documentation

## 2020-03-27 LAB — BASIC METABOLIC PANEL
Anion gap: 11 (ref 5–15)
BUN: 11 mg/dL (ref 6–20)
CO2: 24 mmol/L (ref 22–32)
Calcium: 8.8 mg/dL — ABNORMAL LOW (ref 8.9–10.3)
Chloride: 102 mmol/L (ref 98–111)
Creatinine, Ser: 1.13 mg/dL (ref 0.61–1.24)
GFR calc Af Amer: >60 mL/min (ref >60)
GFR calc non Af Amer: >60 mL/min (ref >60)
Glucose, Bld: 162 mg/dL — ABNORMAL HIGH (ref 70–99)
Potassium: 3.4 mmol/L — ABNORMAL LOW (ref 3.5–5.1)
Sodium: 137 mmol/L (ref 135–145)

## 2020-03-27 LAB — CBC WITH DIFFERENTIAL/PLATELET
Abs Immature Granulocytes: 0.08 10*3/uL — ABNORMAL HIGH (ref 0.00–0.07)
Basophils Absolute: 0.1 10*3/uL (ref 0.0–0.1)
Basophils Relative: 1 %
Eosinophils Absolute: 0.1 10*3/uL (ref 0.0–0.5)
Eosinophils Relative: 2 %
HCT: 40 % (ref 39.0–52.0)
Hemoglobin: 12.6 g/dL — ABNORMAL LOW (ref 13.0–17.0)
Immature Granulocytes: 1 %
Lymphocytes Relative: 24 %
Lymphs Abs: 1.8 10*3/uL (ref 0.7–4.0)
MCH: 21 pg — ABNORMAL LOW (ref 26.0–34.0)
MCHC: 31.5 g/dL (ref 30.0–36.0)
MCV: 66.6 fL — ABNORMAL LOW (ref 80.0–100.0)
Monocytes Absolute: 0.6 10*3/uL (ref 0.1–1.0)
Monocytes Relative: 9 %
Neutro Abs: 4.6 10*3/uL (ref 1.7–7.7)
Neutrophils Relative %: 63 %
Platelets: 262 10*3/uL (ref 150–400)
RBC: 6.01 MIL/uL — ABNORMAL HIGH (ref 4.22–5.81)
RDW: 19.5 % — ABNORMAL HIGH (ref 11.5–15.5)
Smear Review: NORMAL
WBC: 7.2 10*3/uL (ref 4.0–10.5)
nRBC: 0 % (ref 0.0–0.2)

## 2020-03-27 LAB — MAGNESIUM: Magnesium: 2.3 mg/dL (ref 1.7–2.4)

## 2020-03-27 MED ORDER — SODIUM CHLORIDE 0.9 % IV BOLUS
1000.0000 mL | Freq: Once | INTRAVENOUS | Status: AC
  Administered 2020-03-27: 1000 mL via INTRAVENOUS

## 2020-03-27 NOTE — Discharge Instructions (Signed)
Follow up with your family doc.  Return for recurrent or persistent symptoms.

## 2020-03-27 NOTE — ED Provider Notes (Addendum)
[facility] EMERGENCY DEPARTMENT Provider Note   CSN: [id number] Arrival date & time: 03/27/20  1034     History Chief Complaint  Patient presents with  . Tachycardia    anxiety    Philip Harris is a 54 y.o. male.  54 yo M with a chief complaints of palpitations.  Patient states that he will feel his heart start racing and then he checks his blood pressure.  Typically is elevated and then will check it again and will increase.  He has had this problem off and on.  Feels it is improved somewhat now.  Takes propanolol and amlodipine.  He is worried that he may have a heart attack or stroke of his blood pressure is elevated.  He denies any chest pain or shortness of breath denies one-sided numbness or weakness denies difficulty with speech or swallowing.  Denies difficulty with ambulation.  Has been eating and drinking normally.  Denies nausea vomiting or diarrhea denies abdominal pain.  Denies stimulant or diet pills or caffeine.  This seems to come and go at random.  Not exertional.  The history is provided by the patient.  Illness Severity:  Moderate Onset quality:  Gradual Duration:  1 week Timing:  Intermittent Progression:  Waxing and waning Chronicity:  Recurrent Associated symptoms: no abdominal pain, no chest pain, no congestion, no cough, no diarrhea, no fever, no headaches, no myalgias, no rash, no shortness of breath and no vomiting        Past Medical History:  Diagnosis Date  . High cholesterol   . Hypertension   . Sickle cell trait (HCC)   . Sleep apnea    CPAP    There are no problems to display for this patient.   Past Surgical History:  Procedure Laterality Date  . None         History reviewed. No pertinent family history.  Social History   Tobacco Use  . Smoking status: Never Smoker  . Smokeless tobacco: Never Used  Substance Use Topics  . Alcohol use: Yes    Comment: occ  . Drug use: No    Home Medications Prior to  Admission medications   Medication Sig Start Date End Date Taking? Authorizing Provider  amLODipine (NORVASC) 5 MG tablet TAKE 1 TABLET(5 MG) BY MOUTH DAILY 09/10/18   Rollene Rotunda, MD  cloNIDine (CATAPRES) 0.1 MG tablet Take 1 tablet (0.1 mg total) by mouth 2 (two) times daily as needed (anxiety). 10/16/16   Lyndal Pulley, MD  propranolol (INDERAL) 10 MG tablet TAKE 1 BY MOUTH THREE TIMES DAILY 02/17/20   Rollene Rotunda, MD  simvastatin (ZOCOR) 20 MG tablet Take 1 tablet (20 mg total) by mouth daily at 6 PM. NEED OV. 11/24/18   Rollene Rotunda, MD    Allergies    Dextromethorphan  Review of Systems   Review of Systems  Constitutional: Negative for chills and fever.  HENT: Negative for congestion and facial swelling.   Eyes: Negative for discharge and visual disturbance.  Respiratory: Negative for cough and shortness of breath.   Cardiovascular: Negative for chest pain and palpitations.  Gastrointestinal: Negative for abdominal pain, diarrhea and vomiting.  Musculoskeletal: Negative for arthralgias and myalgias.  Skin: Negative for color change and rash.  Neurological: Negative for tremors, syncope and headaches.  Psychiatric/Behavioral: Negative for confusion and dysphoric mood.    Physical Exam Updated Vital Signs Pulse (!) 102   Temp 98.4 F (36.9 C)   Resp (!) 23   Ht  5' 5.5" (1.664 m)   Wt 109 kg   SpO2 100%   BMI 39.40 kg/m   Physical Exam Vitals and nursing note reviewed.  Constitutional:      Appearance: He is well-developed.  HENT:     Head: Normocephalic and atraumatic.  Eyes:     Pupils: Pupils are equal, round, and reactive to light.  Neck:     Vascular: No JVD.  Cardiovascular:     Rate and Rhythm: Normal rate and regular rhythm.     Heart sounds: No murmur heard.  No friction rub. No gallop.   Pulmonary:     Effort: No respiratory distress.     Breath sounds: No wheezing.  Abdominal:     General: There is no distension.     Tenderness: There is  no guarding or rebound.  Musculoskeletal:        General: Normal range of motion.     Cervical back: Normal range of motion and neck supple.  Skin:    Coloration: Skin is not pale.     Findings: No rash.  Neurological:     Mental Status: He is alert and oriented to person, place, and time.  Psychiatric:        Behavior: Behavior normal.     ED Results / Procedures / Treatments   Labs (all labs ordered are listed, but only abnormal results are displayed) Labs Reviewed  CBC WITH DIFFERENTIAL/PLATELET - Abnormal; Notable for the following components:      Result Value   RBC 6.01 (*)    Hemoglobin 12.6 (*)    MCV 66.6 (*)    MCH 21.0 (*)    RDW 19.5 (*)    Abs Immature Granulocytes 0.08 (*)    All other components within normal limits  BASIC METABOLIC PANEL - Abnormal; Notable for the following components:   Potassium 3.4 (*)    Glucose, Bld 162 (*)    Calcium 8.8 (*)    All other components within normal limits  MAGNESIUM    EKG EKG Interpretation  Date/Time:  Monday March 27 2020 10:42:13 EDT Ventricular Rate:  101 PR Interval:    QRS Duration: 83 QT Interval:  333 QTC Calculation: 432 R Axis:   80 Text Interpretation: Sinus tachycardia Borderline T abnormalities, diffuse leads No significant change since last tracing Confirmed by Deno Etienne [phone]) on 03/27/2020 10:44:38 AM   Radiology No results found.  Procedures Procedures (including critical care time)  Medications Ordered in ED Medications  sodium chloride 0.9 % bolus 1,000 mL (1,000 mLs Intravenous New Bag/Given 03/27/20 1107)    ED Course  I have reviewed the triage vital signs and the nursing notes.  Pertinent labs & imaging results that were available during my care of the patient were reviewed by me and considered in my medical decision making (see chart for details).    MDM Rules/Calculators/A&P                      54 yo M with a history of palpitations on amlodipine and propanolol comes in  with a chief complaints of palpitations.  Sinus tachycardia on arrival with a heart rate in the low 100s.  Highest documented is 102.  In the 90s on my initial exam.  Well-appearing and nontoxic.  He seems somewhat worried about his blood pressure and she is heart rate.  He is worried that he may need to be on more medication.  He is not having  any chest pain or shortness of breath.  No painful stimulus of any kind.  No decreased oral intake per him.  Give a bolus of IV fluids and check electrolytes.  I feel this is completely atypical of ACS and requires no further work-up.  Patient's heart rate is improved with IV fluids.  Down the 39s.  Work-up with a very mild hypokalemia 3.4, very mild anemia at 12.6.  Unlikely to cause his symptoms.  We will have him follow-up with his family doctor in the office.  He tells me that he seen a cardiologist previously.  We will have him contact them and see if they want a resee him in the office.  12:21 PM:  I have discussed the diagnosis/risks/treatment options with the patient and family and believe the pt to be eligible for discharge home to follow-up with PCP, cards. We also discussed returning to the ED immediately if new or worsening sx occur. We discussed the sx which are most concerning (e.g., chest pain, sob inability to tolerate by mouth) that necessitate immediate return. Medications administered to the patient during their visit and any new prescriptions provided to the patient are listed below.  Medications given during this visit Medications  sodium chloride 0.9 % bolus 1,000 mL (1,000 mLs Intravenous New Bag/Given 03/27/20 1107)     The patient appears reasonably screen and/or stabilized for discharge and I doubt any other medical condition or other [facility] requiring further screening, evaluation, or treatment in the ED at this time prior to discharge.   Final Clinical Impression(s) / ED Diagnoses Final diagnoses:  Sinus tachycardia    Rx / DC  Orders ED Discharge Orders    None       Melene Plan, DO 03/27/20 1221    Melene Plan, DO 04/06/20 1440

## 2020-03-27 NOTE — Progress Notes (Signed)
Cardiology Office Note   Date:  03/28/2020   ID:  Philip Harris, DOB 02/05/1966, MRN [id number]  PCP:  Wilburn Mylar, MD  Cardiologist:   No primary care provider on file.   Chief Complaint  Patient presents with  . Palpitations      History of Present Illness: Philip Harris is a 54 y.o. male who presents for follow-up palpitations.  He was treated with propranolol prn.     He continues to feel very anxious.  He actually was in the emergency room yesterday and I reviewed these records.  His heart was racing.  I see that it was 102 and they recorded it.  He had sinus tachycardia.  His electrolytes were okay except his potassium was very borderline 3.4.  He was treated with hydration.  EKG was nonacute.  He says he continues to get very anxious like today.  His blood pressure is elevated today.  He shows me some readings at home that are for the most part well controlled.  He is taking his propranolol oftentimes once a day.  Sometimes if he is more anxious her blood pressure is up he might take it twice a day.  He is not having any chest pressure, neck or arm discomfort.  Is not having any shortness of breath, PND or orthopnea.  He has had no weight gain or edema.  I did check his Alive Cor readings.  There were no arrhythmias other than some occasional PACs.    Past Medical History:  Diagnosis Date  . High cholesterol   . Hypertension   . Sickle cell trait (HCC)   . Sleep apnea    CPAP    Past Surgical History:  Procedure Laterality Date  . None       Current Outpatient Medications  Medication Sig Dispense Refill  . amLODipine (NORVASC) 5 MG tablet TAKE 1 TABLET(5 MG) BY MOUTH DAILY 30 tablet 4  . pravastatin (PRAVACHOL) 10 MG tablet Take 10 mg by mouth at bedtime.    . propranolol (INDERAL) 10 MG tablet Take 1 tablet (10 mg total) by mouth 3 (three) times daily as needed. 180 tablet 3  . atenolol (TENORMIN) 25 MG tablet Take 1 tablet (25 mg total) by mouth  in the morning. 90 tablet 3   No current facility-administered medications for this visit.    Allergies:   Dextromethorphan    ROS:  Please see the history of present illness.   Otherwise, review of systems are positive for none.   All other systems are reviewed and negative.    PHYSICAL EXAM: VS:  BP (!) 162/88   Pulse 87   Wt 237 lb (107.5 kg)   SpO2 98%   BMI 38.84 kg/m  , BMI Body mass index is 38.84 kg/m. GENERAL:  Well appearing NECK:  No jugular venous distention, waveform within normal limits, carotid upstroke brisk and symmetric, no bruits, no thyromegaly LUNGS:  Clear to auscultation bilaterally CHEST:  Unremarkable HEART:  PMI not displaced or sustained,S1 and S2 within normal limits, no S3, no S4, no clicks, no rubs, no murmurs ABD:  Flat, positive bowel sounds normal in frequency in pitch, no bruits, no rebound, no guarding, no midline pulsatile mass, no hepatomegaly, no splenomegaly EXT:  2 plus pulses throughout, no edema, no cyanosis no clubbing     EKG:  EKG is ordered today. The ekg ordered today demonstrates sinus rhythm, rate 87, axis within normal limits, nonspecific use T wave flattening  no acute ST-T wave changes.   Recent Labs: 03/27/2020: BUN 11; Creatinine, Ser 1.13; Hemoglobin 12.6; Magnesium 2.3; Platelets 262; Potassium 3.4; Sodium 137    Lipid Panel No results found for: CHOL, TRIG, HDL, CHOLHDL, VLDL, LDLCALC, LDLDIRECT    Wt Readings from Last 3 Encounters:  03/28/20 237 lb (107.5 kg)  03/27/20 240 lb 6.4 oz (109 kg)  11/18/17 226 lb (102.5 kg)      Other studies Reviewed: Additional studies/ records that were reviewed today include: ED records Review of the above records demonstrates:  Please see elsewhere in the note.     ASSESSMENT AND PLAN:  TACHYCARDIA:     At this point I am going to start atenolol 25 mg daily.  He can still take a as needed propranolol.  He will continue his other meds as listed.  I am going to repeat for  him to psychiatry for consideration of management of anxiety.  He is going to continue with his Alive cor and send me readings.   HTN:   Blood pressure will be managed with the addition of the beta-blocker scheduled atenolol.  He will take his as needed propranolol.  We talked about tools for relieving anxiety.      Current medicines are reviewed at length with the patient today.  The patient does not have concerns regarding medicines.  The following changes have been made:  no change  Labs/ tests ordered today include: TSH  Orders Placed This Encounter  Procedures  . Basic metabolic panel  . TSH  . Lipid panel  . EKG 12-Lead     Disposition:   FU with me in six weeks.      Signed, Minus Breeding, MD  03/28/2020 9:58 AM    [facility]

## 2020-03-27 NOTE — ED Triage Notes (Signed)
Woke up this morning with elevated heart rate and nervousness.

## 2020-03-28 ENCOUNTER — Encounter: Payer: Self-pay | Admitting: Cardiology

## 2020-03-28 ENCOUNTER — Ambulatory Visit: Payer: 59 | Admitting: Cardiology

## 2020-03-28 VITALS — BP 162/88 | HR 87 | Wt 237.0 lb

## 2020-03-28 DIAGNOSIS — R000 Tachycardia, unspecified: Secondary | ICD-10-CM | POA: Diagnosis not present

## 2020-03-28 DIAGNOSIS — E785 Hyperlipidemia, unspecified: Secondary | ICD-10-CM

## 2020-03-28 DIAGNOSIS — I10 Essential (primary) hypertension: Secondary | ICD-10-CM | POA: Diagnosis not present

## 2020-03-28 LAB — LIPID PANEL
Chol/HDL Ratio: 3.6 ratio (ref 0.0–5.0)
Cholesterol, Total: 187 mg/dL (ref 100–199)
HDL: 52 mg/dL (ref >39)
LDL Chol Calc (NIH): 121 mg/dL — ABNORMAL HIGH (ref 0–99)
Triglycerides: 73 mg/dL (ref 0–149)
VLDL Cholesterol Cal: 14 mg/dL (ref 5–40)

## 2020-03-28 LAB — BASIC METABOLIC PANEL
BUN/Creatinine Ratio: 13 (ref 9–20)
BUN: 12 mg/dL (ref 6–24)
CO2: 24 mmol/L (ref 20–29)
Calcium: 9.3 mg/dL (ref 8.7–10.2)
Chloride: 102 mmol/L (ref 96–106)
Creatinine, Ser: 0.94 mg/dL (ref 0.76–1.27)
GFR calc Af Amer: 105 mL/min/{1.73_m2} (ref >59)
GFR calc non Af Amer: 91 mL/min/{1.73_m2} (ref >59)
Glucose: 102 mg/dL — ABNORMAL HIGH (ref 65–99)
Potassium: 4.5 mmol/L (ref 3.5–5.2)
Sodium: 139 mmol/L (ref 134–144)

## 2020-03-28 LAB — TSH: TSH: 1.87 u[IU]/mL (ref 0.450–4.500)

## 2020-03-28 MED ORDER — PROPRANOLOL HCL 10 MG PO TABS: 10.0000 mg | ORAL_TABLET | Freq: Three times a day (TID) | ORAL | 3 refills | Status: AC | PRN

## 2020-03-28 MED ORDER — ATENOLOL 25 MG PO TABS: 25.0000 mg | ORAL_TABLET | Freq: Every morning | ORAL | 3 refills | Status: DC

## 2020-03-28 NOTE — Patient Instructions (Addendum)
Medication Instructions:  START ATENOLOL 25MG  DAILY CHANGE PROPRANOLOL TO THREE TIMES A DAY AS NEEDED STOP CLONIDINE *If you need a refill on your cardiac medications before your next appointment, please call your pharmacy*  Lab Work: Your physician recommends that you return for lab work TODAY (BMP, TSH, LIPIDS) If you have labs (blood work) drawn today and your tests are completely normal, you will receive your results only by: MyChart Message (if you have MyChart) OR . A paper copy in the mail If you have any lab test that is abnormal or we need to change your treatment, we will call you to review the results.  Testing/Procedures: NONE ORDERED THIS VISIT  Follow-Up: At [facility], you and your health needs are our priority.  As part of our continuing mission to provide you with exceptional heart care, we have created designated Provider Care Teams.  These Care Teams include your primary Cardiologist (physician) and Advanced Practice Providers (APPs -  Physician Assistants and Nurse Practitioners) who all work together to provide you with the care you need, when you need it.  Your next appointment:   1 month(s)  The format for your next appointment:   In Person  Provider:   [facility], MD  Other Instructions SEND IN ECG INTERPRETATIONS / HEART RATE READINGS PLEASE  DR. Rollene Rotunda [phone]

## 2020-03-30 ENCOUNTER — Telehealth: Payer: Self-pay | Admitting: Cardiology

## 2020-03-30 NOTE — Telephone Encounter (Signed)
Spoke with pt re meds Pt has had to take a couple of Propanolol since starting the Atenolol Pt aware that he may take Propanolol tid prn and instructed pt if notes having to take this frequently to call office back Also informed pt to allow the Atenolol a few more doses and see if this helps with the palpitations Pt verbalizes understanding ./cy

## 2020-03-30 NOTE — Telephone Encounter (Signed)
New Message:      Please call, question about his Atenolol.

## 2020-05-24 MED ORDER — ATENOLOL 50 MG PO TABS: 50.0000 mg | ORAL_TABLET | Freq: Every day | ORAL | 3 refills | Status: AC

## 2020-05-24 NOTE — Telephone Encounter (Signed)
I left a message for patient to call back.

## 2020-05-24 NOTE — Telephone Encounter (Signed)
Patient returned call

## 2020-06-07 DIAGNOSIS — F411 Generalized anxiety disorder: Secondary | ICD-10-CM | POA: Insufficient documentation

## 2020-06-15 ENCOUNTER — Telehealth: Payer: Self-pay | Admitting: Cardiology

## 2020-06-15 NOTE — Telephone Encounter (Signed)
Patient has some medical question, he sent on my chart.  He is wondering if another provider can answer it for him.

## 2020-06-15 NOTE — Telephone Encounter (Signed)
Spoke with pt, he worked out on Monday 8/16 and lifted weights and worked out pretty hard. He reports he felt a little muscle soreness on Monday pm. Tuesday he was a little stiff and took tylenol. Wednesday he went to his medical doctor and told them about the soreness and they checked lab work. His CK was 469. He waited 4 days without exercise and rechecked and it is 441. He went on the intranet and it states that and elevated CK can be from heart muscle damage. He is concerned and called. He has had no other pain. Explained to patient the elevated CK is a vague test and is related to muscle damage but it can be any muscle in the body. Usually related to statins. Discussed with dr [place] to confirm but patient instructed to stop the pravastatin for 2 weeks and then have retested. If the CK is still elevated it is a skeletal muscle issue and is not related to his heart. Patient voiced understanding and will send to patient in my chart at his request.

## 2020-11-15 DIAGNOSIS — E559 Vitamin D deficiency, unspecified: Secondary | ICD-10-CM | POA: Insufficient documentation

## 2020-11-15 DIAGNOSIS — R7303 Prediabetes: Secondary | ICD-10-CM | POA: Insufficient documentation

## 2020-11-16 DIAGNOSIS — E538 Deficiency of other specified B group vitamins: Secondary | ICD-10-CM | POA: Insufficient documentation

## 2023-12-19 DIAGNOSIS — G5603 Carpal tunnel syndrome, bilateral upper limbs: Secondary | ICD-10-CM | POA: Insufficient documentation

## 2024-05-12 ENCOUNTER — Institutional Professional Consult (permissible substitution): Admitting: Nurse Practitioner

## 2024-05-13 ENCOUNTER — Ambulatory Visit: Admitting: Nurse Practitioner

## 2024-05-13 ENCOUNTER — Encounter: Payer: Self-pay | Admitting: Nurse Practitioner

## 2024-05-13 VITALS — BP 135/77 | HR 76 | Temp 98.0°F | Ht 65.5 in | Wt 237.0 lb

## 2024-05-13 DIAGNOSIS — E559 Vitamin D deficiency, unspecified: Secondary | ICD-10-CM

## 2024-05-13 DIAGNOSIS — G4733 Obstructive sleep apnea (adult) (pediatric): Secondary | ICD-10-CM | POA: Diagnosis not present

## 2024-05-13 DIAGNOSIS — I10 Essential (primary) hypertension: Secondary | ICD-10-CM

## 2024-05-13 DIAGNOSIS — E7800 Pure hypercholesterolemia, unspecified: Secondary | ICD-10-CM | POA: Diagnosis not present

## 2024-05-13 DIAGNOSIS — Z6838 Body mass index (BMI) 38.0-38.9, adult: Secondary | ICD-10-CM

## 2024-05-13 DIAGNOSIS — R7303 Prediabetes: Secondary | ICD-10-CM | POA: Diagnosis not present

## 2024-05-13 DIAGNOSIS — E538 Deficiency of other specified B group vitamins: Secondary | ICD-10-CM

## 2024-05-13 DIAGNOSIS — E66812 Obesity, class 2: Secondary | ICD-10-CM

## 2024-05-13 NOTE — Progress Notes (Signed)
 Office: [phone]  /  Fax: [phone]   Initial Visit  Philip Harris was seen in clinic today to evaluate for obesity. He is interested in losing weight to improve overall health and reduce the risk of weight related complications. He presents today to review program treatment options, initial physical assessment, and evaluation.     He was referred by: Friend or Family  When asked what else they would like to accomplish? He states: Adopt a healthier eating pattern and lifestyle, Improve energy levels and physical activity, Improve existing medical conditions, and Improve quality of life   When asked how has your weight affected you? He states: Contributed to medical problems, Contributed to orthopedic problems or mobility issues, Having fatigue, and Having poor endurance  Some associated conditions: vit d def, vit b12 def, prediabetes, HTN, tachycardia, HLD, OSAS on CPAP, iron def anemia  Contributing factors: family history of obesity, reduced physical activity, and sedentary job  Weight promoting medications identified: None  Current nutrition plan: None  Current level of physical activity: None  Current or previous pharmacotherapy: None  Response to medication: Never tried medications   Past medical history includes:   Past Medical History:  Diagnosis Date   High cholesterol    Hypertension    Sickle cell trait (HCC)    Sleep apnea    CPAP     Objective:   BP 135/77   Pulse 76   Temp 98 F (36.7 C)   Ht 5' 5.5 (1.664 m)   Wt 237 lb (107.5 kg)   SpO2 97%   BMI 38.84 kg/m  He was weighed on the bioimpedance scale: Body mass index is 38.84 kg/m.  Peak Weight:237 , Body Fat%:32.5, Visceral Fat Rating:20, Weight trend over the last 12 months: Unchanged  General:  Alert, oriented and cooperative. Patient is in no acute distress.  Respiratory: Normal respiratory effort, no problems with respiration noted   Gait: able to ambulate independently  Mental  Status: Normal mood and affect. Normal behavior. Normal judgment and thought content.   DIAGNOSTIC DATA REVIEWED:  BMET    Component Value Date/Time   NA 139 03/28/2020 0953   K 4.5 03/28/2020 0953   CL 102 03/28/2020 0953   CO2 24 03/28/2020 0953   GLUCOSE 102 (H) 03/28/2020 0953   GLUCOSE 162 (H) 03/27/2020 1050   BUN 12 03/28/2020 0953   CREATININE 0.94 03/28/2020 0953   CALCIUM 9.3 03/28/2020 0953   GFRNONAA 91 03/28/2020 0953   GFRAA 105 03/28/2020 0953   No results found for: HGBA1C No results found for: INSULIN CBC    Component Value Date/Time   WBC 7.2 03/27/2020 1050   RBC 6.01 (H) 03/27/2020 1050   HGB 12.6 (L) 03/27/2020 1050   HCT 40.0 03/27/2020 1050   PLT 262 03/27/2020 1050   MCV 66.6 (L) 03/27/2020 1050   MCH 21.0 (L) 03/27/2020 1050   MCHC 31.5 03/27/2020 1050   RDW 19.5 (H) 03/27/2020 1050   Iron/TIBC/Ferritin/ %Sat No results found for: IRON, TIBC, FERRITIN, IRONPCTSAT Lipid Panel     Component Value Date/Time   CHOL 187 03/28/2020 0953   TRIG 73 03/28/2020 0953   HDL 52 03/28/2020 0953   CHOLHDL 3.6 03/28/2020 0953   LDLCALC 121 (H) 03/28/2020 0953   Hepatic Function Panel     Component Value Date/Time   PROT 7.8 11/17/2017 1824   ALBUMIN 4.5 11/17/2017 1824   AST 22 11/17/2017 1824   ALT 36 11/17/2017 1824   ALKPHOS 77  11/17/2017 1824   BILITOT 0.7 11/17/2017 1824      Component Value Date/Time   TSH 1.870 03/28/2020 0953     Assessment and Plan:   Prediabetes Continue follow-up with PCP. Philip Harris needs to work  on weight loss, exercise, and decreasing simple carbohydrates to help decrease the risk of diabetes.   Last A1c was 6.1  Essential hypertension Continue follow-up with PCP.  Continue medications as directed.  Keep appointment with cardiology on 06/25/2024.  Pure hypercholesterolemia Continue follow-up with PCP.  Continue medications as directed.  OSA on CPAP Continue CPAP nightly  B12  deficiency Continue follow-up with PCP  Vitamin D deficiency Continue follow-up with PCP  Obesity, Class II, BMI 35-39.9        Obesity Treatment / Action Plan:  Patient will work on garnering support from family and friends to begin weight loss journey. Will work on eliminating or reducing the presence of highly palatable, calorie dense foods in the home. Will complete provided nutritional and psychosocial assessment questionnaire before the next appointment. Will be scheduled for indirect calorimetry to determine resting energy expenditure in a fasting state.  This will allow us  to create a reduced calorie, high-protein meal plan to promote loss of fat mass while preserving muscle mass. Counseled on the health benefits of losing 5%-15% of total body weight. Was counseled on nutritional approaches to weight loss and benefits of reducing processed foods and consuming plant-based foods and high quality protein as part of nutritional weight management. Was counseled on pharmacotherapy and role as an adjunct in weight management.   Obesity Education Performed Today:  He was weighed on the bioimpedance scale and results were discussed and documented in the synopsis.  We discussed obesity as a disease and the importance of a more detailed evaluation of all the factors contributing to the disease.  We discussed the importance of long term lifestyle changes which include nutrition, exercise and behavioral modifications as well as the importance of customizing this to his specific health and social needs.  We discussed the benefits of reaching a healthier weight to alleviate the symptoms of existing conditions and reduce the risks of the biomechanical, metabolic and psychological effects of obesity.  Philip Harris appears to be in the action stage of change and states they are ready to start intensive lifestyle modifications and behavioral modifications.  30 minutes was spent today on  this visit including the above counseling, pre-visit chart review, and post-visit documentation.  Reviewed by clinician on day of visit: allergies, medications, problem list, medical history, surgical history, family history, social history, and previous encounter notes pertinent to obesity diagnosis.    Philip SAUNDERS Amani Marseille FNP-C

## 2024-06-25 ENCOUNTER — Ambulatory Visit: Admitting: Cardiology

## 2024-06-30 ENCOUNTER — Ambulatory Visit: Admitting: Family Medicine

## 2024-07-14 ENCOUNTER — Ambulatory Visit: Admitting: Family Medicine
